# Patient Record
Sex: Male | Born: 1990
Health system: Southern US, Community
[De-identification: ages and names within clinical notes are randomized; demographics above are authoritative.]

## PROBLEM LIST (undated history)

## (undated) DIAGNOSIS — K219 Gastro-esophageal reflux disease without esophagitis: Secondary | ICD-10-CM

## (undated) DIAGNOSIS — E785 Hyperlipidemia, unspecified: Secondary | ICD-10-CM

## (undated) HISTORY — PX: ARTHROSCOPIC REPAIR ACL: SUR80

## (undated) HISTORY — DX: Gastro-esophageal reflux disease without esophagitis: K21.9

## (undated) HISTORY — DX: Hyperlipidemia, unspecified: E78.5

---
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
## 2012-08-31 ENCOUNTER — Encounter (HOSPITAL_COMMUNITY): Payer: Self-pay | Admitting: Emergency Medicine

## 2012-08-31 ENCOUNTER — Emergency Department (HOSPITAL_COMMUNITY)
Admission: EM | Admit: 2012-08-31 | Discharge: 2012-09-01 | Disposition: A | Discharge status: Home / Self Care | Payer: Managed Care, Other (non HMO) | Attending: Emergency Medicine | Admitting: Emergency Medicine

## 2012-08-31 DIAGNOSIS — K219 Gastro-esophageal reflux disease without esophagitis: Secondary | ICD-10-CM | POA: Insufficient documentation

## 2012-08-31 DIAGNOSIS — R110 Nausea: Secondary | ICD-10-CM | POA: Insufficient documentation

## 2012-08-31 NOTE — ED Notes (Signed)
Pt c/o weakness x 5 days, denies pain, nausea. C/o "weird" intermittent sensation in head. PWD.

## 2012-09-01 LAB — CBC
HCT: 48.1 % (ref 39.0–52.0)
Hemoglobin: 17.5 g/dL — ABNORMAL HIGH (ref 13.0–17.0)
MCH: 30.2 pg (ref 26.0–34.0)
MCHC: 36.4 g/dL — ABNORMAL HIGH (ref 30.0–36.0)
MCV: 83.1 fL (ref 78.0–100.0)
Platelets: 271 10*3/uL (ref 150–400)
WBC: 6.9 10*3/uL (ref 4.0–10.5)

## 2012-09-01 LAB — POCT I-STAT, CHEM 8
BUN: 19 mg/dL (ref 6–23)
Calcium, Ion: 1.22 mmol/L (ref 1.12–1.23)
Chloride: 103 mEq/L (ref 96–112)
Creatinine, Ser: 1.1 mg/dL (ref 0.50–1.35)
Glucose, Bld: 116 mg/dL — ABNORMAL HIGH (ref 70–99)
HCT: 51 % (ref 39.0–52.0)
Hemoglobin: 17.3 g/dL — ABNORMAL HIGH (ref 13.0–17.0)
Sodium: 141 mEq/L (ref 135–145)
TCO2: 29 mmol/L (ref 0–100)

## 2012-09-01 LAB — OCCULT BLOOD, POC DEVICE: Fecal Occult Bld: NEGATIVE

## 2012-09-01 LAB — GLUCOSE, CAPILLARY: Glucose-Capillary: 112 mg/dL — ABNORMAL HIGH (ref 70–99)

## 2012-09-01 MED ORDER — FAMOTIDINE 20 MG PO TABS: 20.0000 mg | ORAL_TABLET | Freq: Two times a day (BID) | ORAL | Status: DC

## 2012-09-01 MED ORDER — GI COCKTAIL ~~LOC~~
30.0000 mL | Freq: Once | ORAL | Status: AC
  Administered 2012-09-01: 30 mL via ORAL
  Filled 2012-09-01: qty 30

## 2012-09-01 MED ORDER — PANTOPRAZOLE SODIUM 40 MG IV SOLR
40.0000 mg | Freq: Once | INTRAVENOUS | Status: AC
  Administered 2012-09-01: 40 mg via INTRAVENOUS
  Filled 2012-09-01: qty 40

## 2012-09-01 MED ORDER — SODIUM CHLORIDE 0.9 % IV BOLUS (SEPSIS)
1000.0000 mL | Freq: Once | INTRAVENOUS | Status: AC
  Administered 2012-09-01: 1000 mL via INTRAVENOUS

## 2012-09-01 MED ORDER — FAMOTIDINE 20 MG PO TABS
20.0000 mg | ORAL_TABLET | Freq: Once | ORAL | Status: AC
  Administered 2012-09-01: 20 mg via ORAL
  Filled 2012-09-01: qty 1

## 2012-09-01 NOTE — ED Provider Notes (Signed)
History     CSN: [id number]  Arrival date & time 08/31/12  2147   First MD Initiated Contact with Patient 09/01/12 0025      Chief Complaint  Patient presents with  . Dizziness    (Consider location/radiation/quality/duration/timing/severity/associated sxs/prior treatment) HPI History provided by patient and parents bedside. The last few days his been having epigastric burning and discomfort with associated nausea. Patient also complaining some dizziness. No syncope. Had noticed some dark stools. No blood in stools. No history of GERD or peptic ulcer disease. Patient concerned that he has been drinking more soda and caffeine, and more bad foods with the holiday season. No right upper quadrant pain. No back pain. Food seems to make his symptoms better but then returned. Is also noticed symptoms worse at nighttime. No fevers. No flank pain or hematuria. No rash or difficulty breathing. No history of same. Symptoms mild/moderate in severity History reviewed. No pertinent past medical history.  Past Surgical History  Procedure Date  . Arthroscopic repair acl     No family history on file.  History  Substance Use Topics  . Smoking status: Never Smoker   . Smokeless tobacco: Not on file  . Alcohol Use: No      Review of Systems  Constitutional: Negative for fever and chills.  HENT: Negative for neck pain and neck stiffness.   Eyes: Negative for pain.  Respiratory: Negative for shortness of breath.   Cardiovascular: Negative for chest pain.  Gastrointestinal: Positive for nausea. Negative for vomiting and abdominal distention.  Genitourinary: Negative for dysuria.  Musculoskeletal: Negative for back pain.  Skin: Negative for rash.  Neurological: Negative for headaches.  All other systems reviewed and are negative.    Allergies  Review of patient's allergies indicates no known allergies.  Home Medications   Current Outpatient Rx  Name  Route  Sig  Dispense  Refill  .  ACETAMINOPHEN 500 MG PO TABS   Oral   Take 500 mg by mouth every 6 (six) hours as needed. pain           BP 126/75  Pulse 77  Temp 98.7 F (37.1 C) (Oral)  Resp 18  Ht 5\' 10"  (1.778 m)  Wt 145 lb (65.772 kg)  BMI 20.81 kg/m2  SpO2 100%  Physical Exam  Constitutional: He is oriented to person, place, and time. He appears well-developed and well-nourished.  HENT:  Head: Normocephalic and atraumatic.  Mouth/Throat: Oropharynx is clear and moist.  Eyes: EOM are normal. Pupils are equal, round, and reactive to light. No scleral icterus.  Neck: Neck supple.  Cardiovascular: Normal rate, regular rhythm and intact distal pulses.   Pulmonary/Chest: Effort normal. No respiratory distress.  Abdominal: Soft. Bowel sounds are normal. He exhibits no distension. There is no rebound and no guarding.       Mild epigastric tenderness. No right upper quadrant tenderness and no abdominal tenderness otherwise. Negative Murphy sign  Genitourinary:       Rectal exam: nontender, soft brown stool  Musculoskeletal: Normal range of motion. He exhibits no edema.  Neurological: He is alert and oriented to person, place, and time. No cranial nerve deficit.  Skin: Skin is warm and dry.    ED Course  Procedures (including critical care time)  Results for orders placed during the hospital encounter of 08/31/12  CBC      Component Value Range   WBC 6.9  4.0 - 10.5 K/uL   RBC 5.79  4.22 - 5.81 MIL/uL  Hemoglobin 17.5 (*) 13.0 - 17.0 g/dL   HCT 16.1  09.6 - 04.5 %   MCV 83.1  78.0 - 100.0 fL   MCH 30.2  26.0 - 34.0 pg   MCHC 36.4 (*) 30.0 - 36.0 g/dL   RDW 40.9  81.1 - 91.4 %   Platelets 271  150 - 400 K/uL  OCCULT BLOOD, POC DEVICE      Component Value Range   Fecal Occult Bld NEGATIVE  NEGATIVE  GLUCOSE, CAPILLARY      Component Value Range   Glucose-Capillary 112 (*) 70 - 99 mg/dL  POCT I-STAT, CHEM 8      Component Value Range   Sodium 141  135 - 145 mEq/L   Potassium 3.8  3.5 - 5.1  mEq/L   Chloride 103  96 - 112 mEq/L   BUN 19  6 - 23 mg/dL   Creatinine, Ser 7.82  0.50 - 1.35 mg/dL   Glucose, Bld 956 (*) 70 - 99 mg/dL   Calcium, Ion 2.13  0.86 - 1.23 mmol/L   TCO2 29  0 - 100 mmol/L   Hemoglobin 17.3 (*) 13.0 - 17.0 g/dL   HCT 57.8  46.9 - 62.9 %   IV fluids. IV Protonix. GI cocktail provided and epigastric discomfort improved. After 1 L of fluids patient ambulates emergency department with resolved dizziness. Plan Pepcid daily with GERD precautions and GI referral as needed.  MDM   Epigastric discomfort and symptoms of GERD. Hemoccult negative for blood and labs reviewed and no anemia. Improved with IV fluids, and GI medications. No indication for imaging at this time. Plan discharge home with Pepcid and GI followup. Vital signs and nursing notes reviewed.        Sunnie Nielsen, MD 09/01/12 [phone]

## 2012-09-01 NOTE — Discharge Instructions (Signed)
Diet for Gastroesophageal Reflux Disease, Adult  Reflux (acid reflux) is when acid from your stomach flows up into the esophagus. When acid comes in contact with the esophagus, the acid causes irritation and soreness (inflammation) in the esophagus. When reflux happens often or so severely that it causes damage to the esophagus, it is called gastroesophageal reflux disease (GERD). Nutrition therapy can help ease the discomfort of GERD.  FOODS OR DRINKS TO AVOID OR LIMIT   Smoking or chewing tobacco. Nicotine is one of the most potent stimulants to acid production in the gastrointestinal tract.   Caffeinated and decaffeinated coffee and black tea.   Regular or low-calorie carbonated beverages or energy drinks (caffeine-free carbonated beverages are allowed).    Strong spices, such as black pepper, white pepper, red pepper, cayenne, curry powder, and chili powder.   Peppermint or spearmint.   Chocolate.   High-fat foods, including meats and fried foods. Extra added fats including oils, butter, salad dressings, and nuts. Limit these to less than 8 tsp per day.   Fruits and vegetables if they are not tolerated, such as citrus fruits or tomatoes.   Alcohol.   Any food that seems to aggravate your condition.  If you have questions regarding your diet, call your caregiver or a registered dietitian.  OTHER THINGS THAT MAY HELP GERD INCLUDE:    Eating your meals slowly, in a relaxed setting.   Eating 5 to 6 small meals per day instead of 3 large meals.   Eliminating food for a period of time if it causes distress.   Not lying down until 3 hours after eating a meal.   Keeping the head of your bed raised 6 to 9 inches (15 to 23 cm) by using a foam wedge or blocks under the legs of the bed. Lying flat may make symptoms worse.   Being physically active. Weight loss may be helpful in reducing reflux in overweight or obese adults.   Wear loose fitting clothing  EXAMPLE MEAL PLAN  This meal plan is approximately  2,000 calories based on [employer] meal planning guidelines.  Breakfast    cup cooked oatmeal.   1 cup strawberries.   1 cup low-fat milk.   1 oz almonds.  Snack   1 cup cucumber slices.   6 oz yogurt (made from low-fat or fat-free milk).  Lunch   2 slice whole-wheat bread.   2 oz sliced turkey.   2 tsp mayonnaise.   1 cup blueberries.   1 cup snap peas.  Snack   6 whole-wheat crackers.   1 oz string cheese.  Dinner    cup brown rice.   1 cup mixed veggies.   1 tsp olive oil.   3 oz grilled fish.  Document Released: 08/22/2005 Document Revised: 11/14/2011 Document Reviewed: 07/08/2011  [employer] Patient Information 2013 [employer].

## 2014-06-17 ENCOUNTER — Other Ambulatory Visit: Payer: Self-pay | Admitting: Family Medicine

## 2014-06-17 ENCOUNTER — Ambulatory Visit
Admission: RE | Admit: 2014-06-17 | Discharge: 2014-06-17 | Disposition: A | Discharge status: Home / Self Care | Payer: Managed Care, Other (non HMO) | Source: Ambulatory Visit | Attending: Family Medicine | Admitting: Family Medicine

## 2014-06-17 DIAGNOSIS — N509 Disorder of male genital organs, unspecified: Secondary | ICD-10-CM

## 2015-05-08 IMAGING — US US SCROTUM
1 series · 14 of 25 positions shown · non-contrast
Comparison: None.

CLINICAL DATA: Right testicular mass detected 1 week ago.  No pain.

EXAM:
ULTRASOUND OF SCROTUM
TECHNIQUE: Complete ultrasound examination of the testicles, epididymis, and
other scrotal structures was performed.

[Series 1: us scrotum · 14 of 34 slices shown]
[im 1/34]
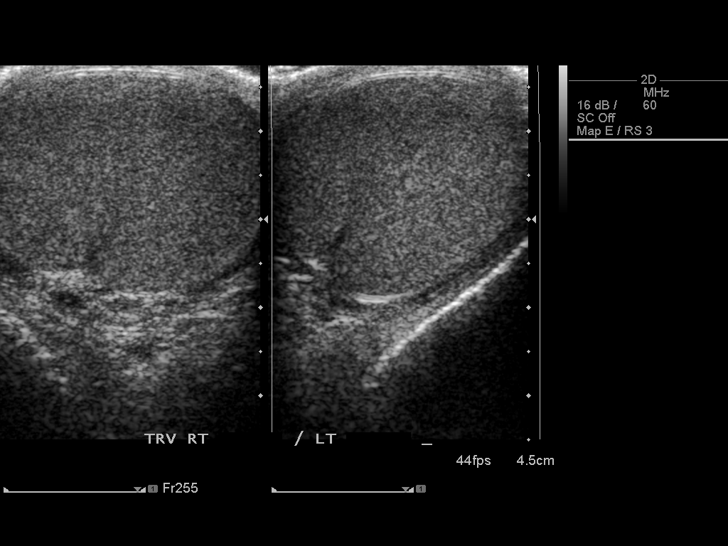
[im 3/34]
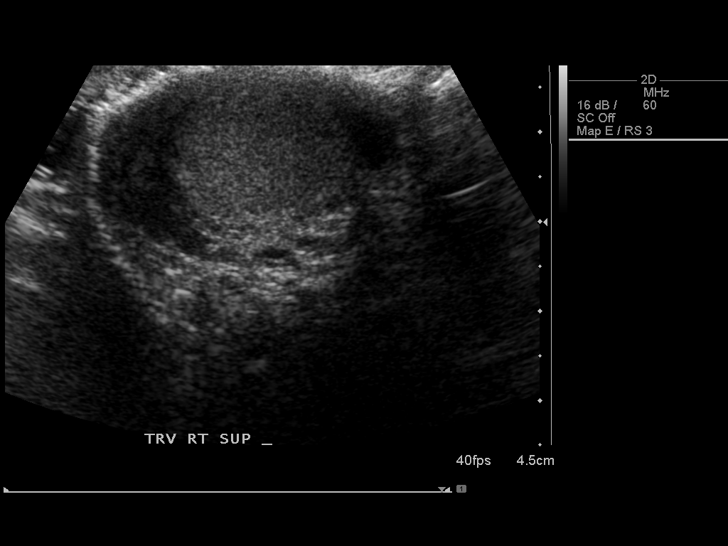
[im 6/34]
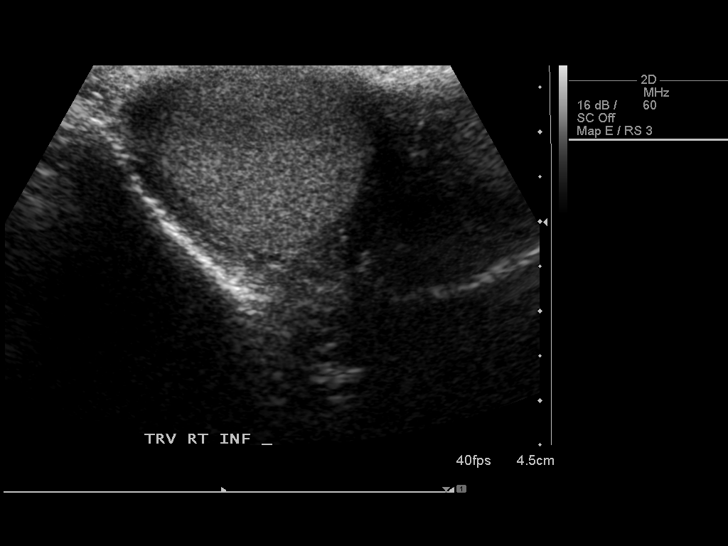
[im 9/34]
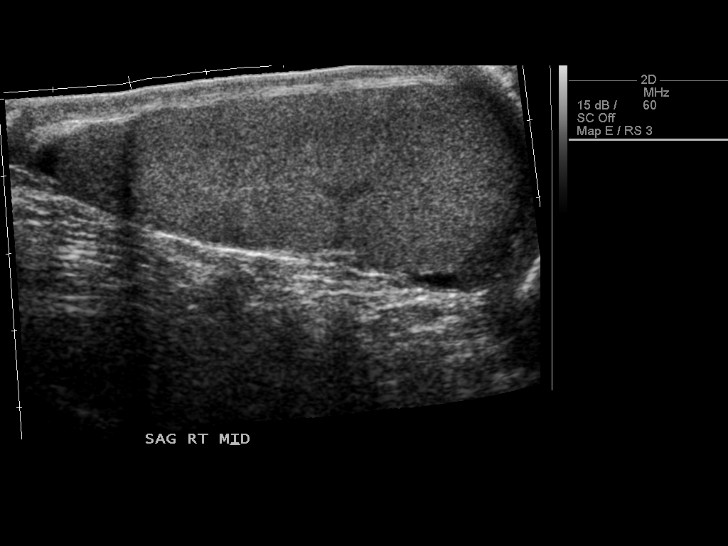
[im 12/34]
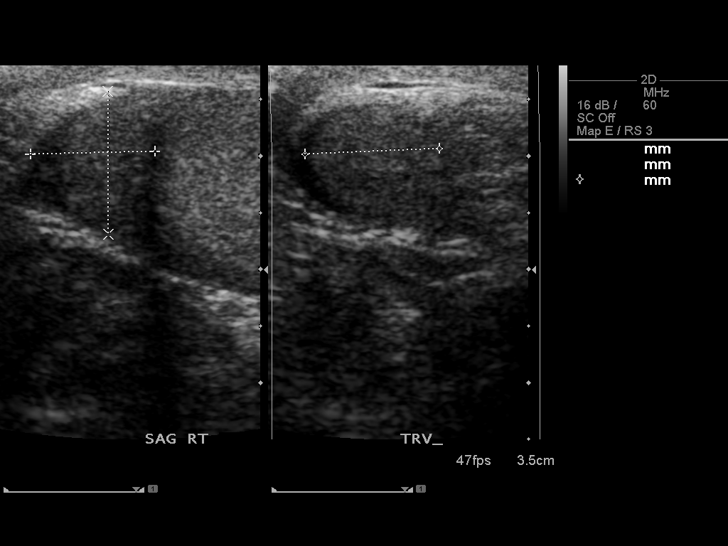
[im 13/34]
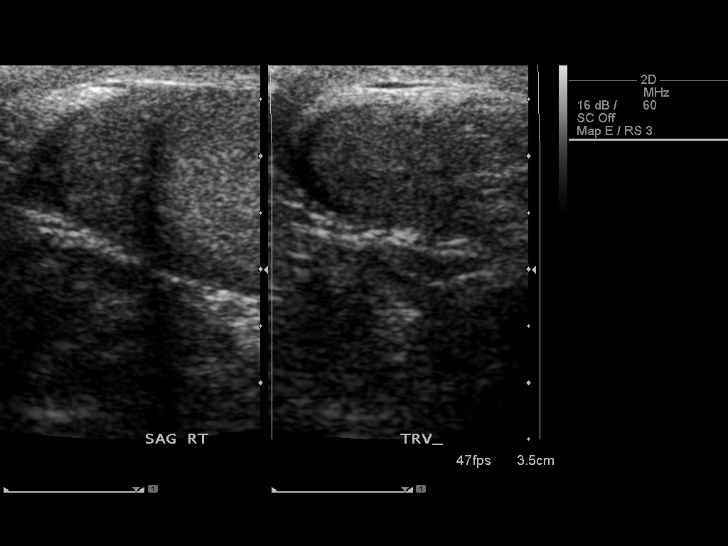
[im 16/34]
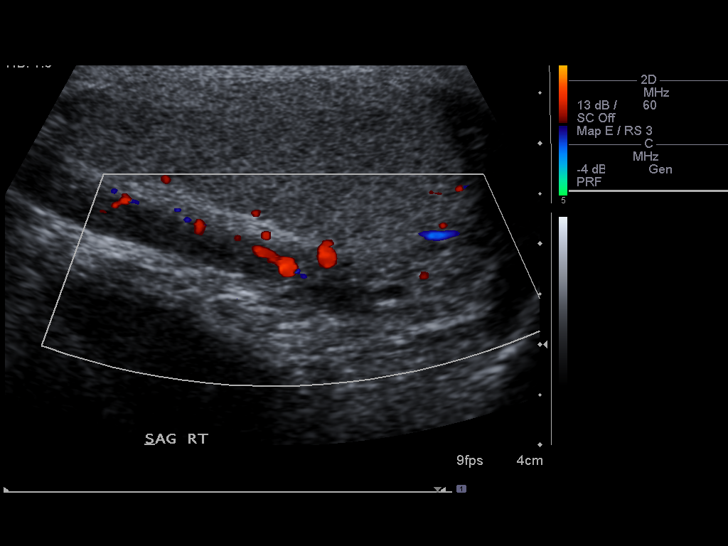
[im 18/34]
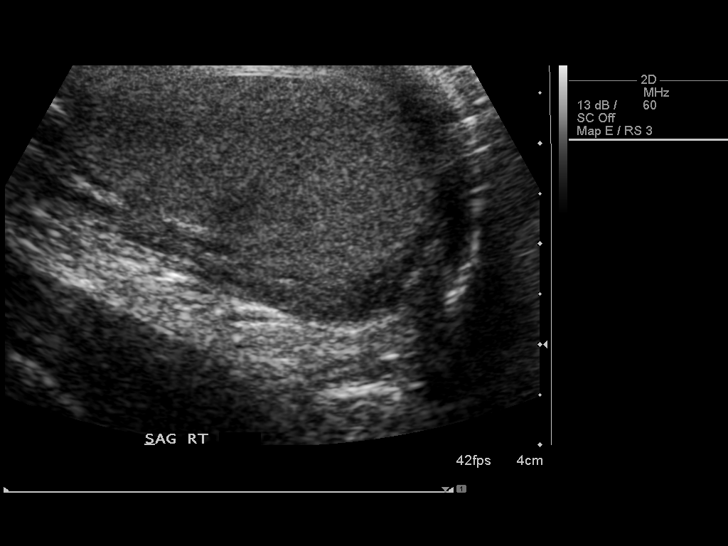
[im 21/34]
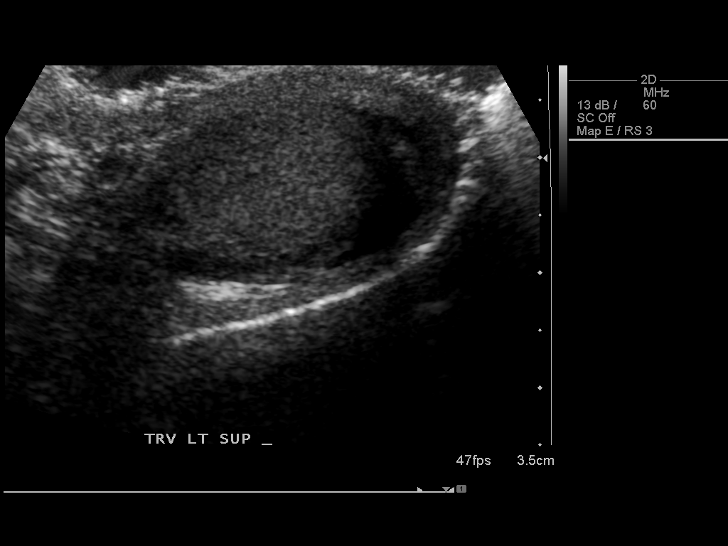
[im 23/34]
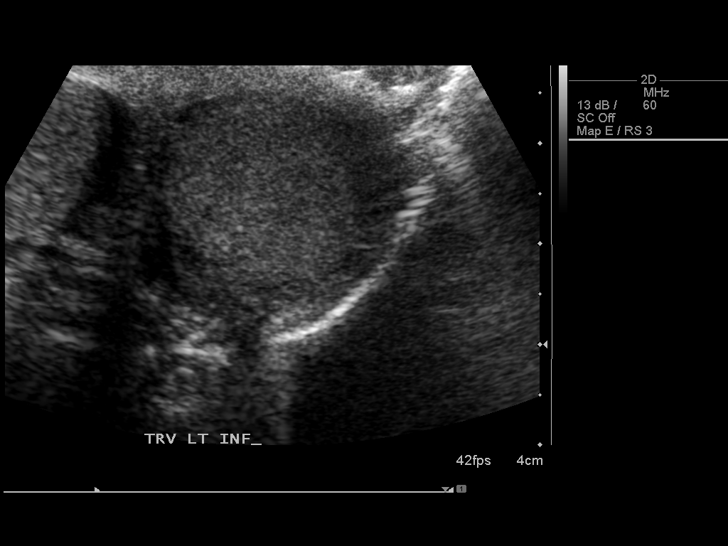
[im 25/34]
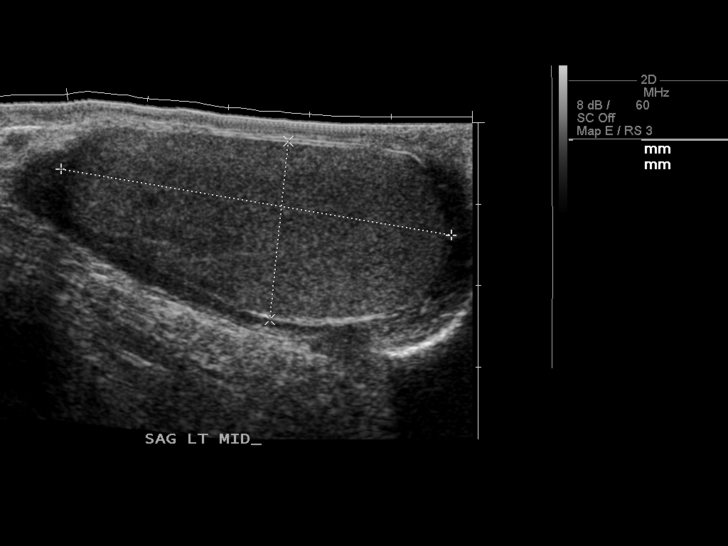
[im 28/34]
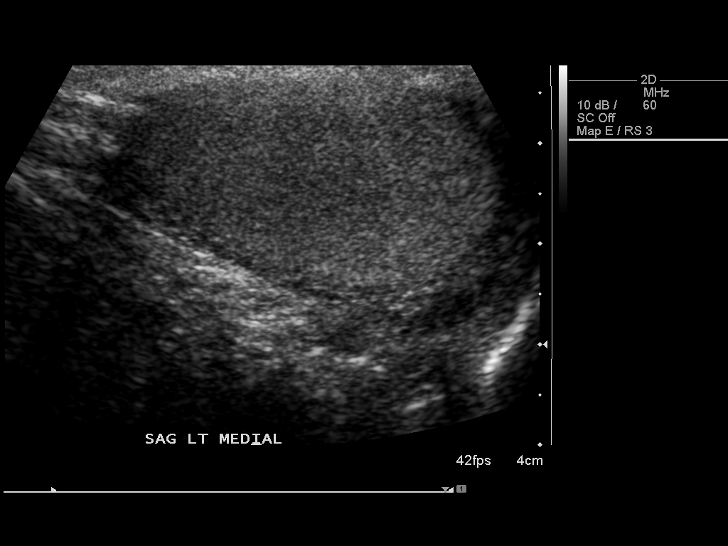
[im 31/34]
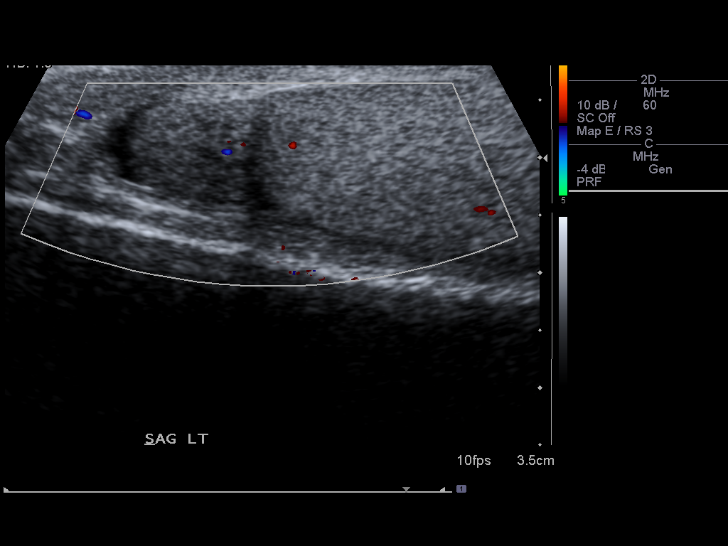
[im 34/34]
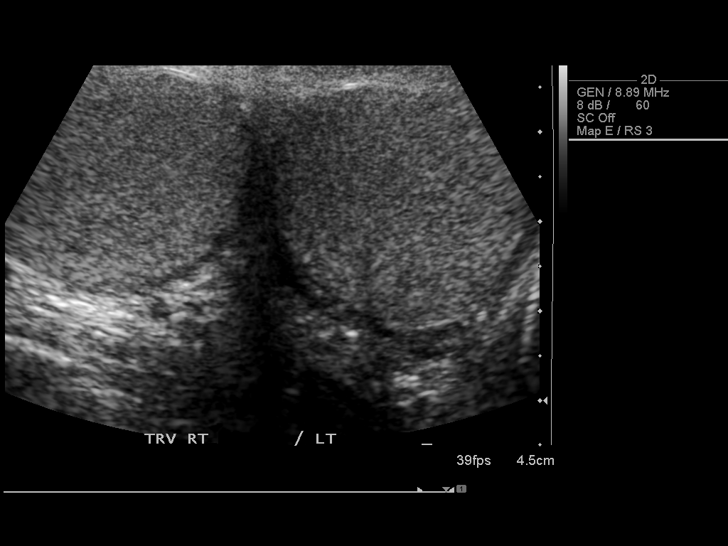

[14 of 25 positions shown; findings below may reference images not displayed]

FINDINGS: Right testicle

Measurements: 5.1 by 2.5 by 3.6 cm. No mass or microlithiasis
visualized.

Left testicle

Measurements: 4.9 by 2.2 by 3.1 cm. No mass or microlithiasis
visualized.

Right epididymis:  Normal in size and appearance.

Left epididymis:  Normal in size and appearance.

Hydrocele:  None visualized.

Varicocele:  None visualized.
IMPRESSION: 1. The area being palpated appears to be the right epididymal head,
which has a normal and reasonably symmetric appearance to the left
side. No testicular mass is observed. If progressive enlargement or
development of pain is encounter, then repeat sonography may be
warranted in the future.

## 2020-04-04 ENCOUNTER — Emergency Department (HOSPITAL_BASED_OUTPATIENT_CLINIC_OR_DEPARTMENT_OTHER)
Admission: EM | Admit: 2020-04-04 | Discharge: 2020-04-04 | Disposition: A | Discharge status: Home / Self Care | Payer: Managed Care, Other (non HMO) | Attending: Emergency Medicine | Admitting: Emergency Medicine

## 2020-04-04 ENCOUNTER — Encounter (HOSPITAL_BASED_OUTPATIENT_CLINIC_OR_DEPARTMENT_OTHER): Payer: Self-pay | Admitting: Emergency Medicine

## 2020-04-04 ENCOUNTER — Emergency Department (HOSPITAL_BASED_OUTPATIENT_CLINIC_OR_DEPARTMENT_OTHER): Payer: Managed Care, Other (non HMO)

## 2020-04-04 ENCOUNTER — Other Ambulatory Visit: Payer: Self-pay

## 2020-04-04 DIAGNOSIS — N200 Calculus of kidney: Secondary | ICD-10-CM | POA: Insufficient documentation

## 2020-04-04 DIAGNOSIS — R350 Frequency of micturition: Secondary | ICD-10-CM | POA: Insufficient documentation

## 2020-04-04 DIAGNOSIS — N3001 Acute cystitis with hematuria: Secondary | ICD-10-CM | POA: Diagnosis not present

## 2020-04-04 DIAGNOSIS — M545 Low back pain: Secondary | ICD-10-CM | POA: Diagnosis present

## 2020-04-04 LAB — URINALYSIS, MICROSCOPIC (REFLEX)

## 2020-04-04 LAB — COMPREHENSIVE METABOLIC PANEL
ALT: 17 U/L (ref 0–44)
AST: 16 U/L (ref 15–41)
Albumin: 5 g/dL (ref 3.5–5.0)
Alkaline Phosphatase: 57 U/L (ref 38–126)
Anion gap: 13 (ref 5–15)
BUN: 14 mg/dL (ref 6–20)
CO2: 25 mmol/L (ref 22–32)
Calcium: 9.4 mg/dL (ref 8.9–10.3)
Chloride: 99 mmol/L (ref 98–111)
Creatinine, Ser: 0.97 mg/dL (ref 0.61–1.24)
GFR calc Af Amer: >60 mL/min (ref >60)
GFR calc non Af Amer: >60 mL/min (ref >60)
Glucose, Bld: 111 mg/dL — ABNORMAL HIGH (ref 70–99)
Potassium: 3.5 mmol/L (ref 3.5–5.1)
Sodium: 137 mmol/L (ref 135–145)
Total Bilirubin: 1.1 mg/dL (ref 0.3–1.2)
Total Protein: 7.9 g/dL (ref 6.5–8.1)

## 2020-04-04 LAB — CBC WITH DIFFERENTIAL/PLATELET
Abs Immature Granulocytes: 0.03 10*3/uL (ref 0.00–0.07)
Basophils Absolute: 0 10*3/uL (ref 0.0–0.1)
Basophils Relative: 0 %
Eosinophils Absolute: 0 10*3/uL (ref 0.0–0.5)
Eosinophils Relative: 0 %
HCT: 50.9 % (ref 39.0–52.0)
Hemoglobin: 17.3 g/dL — ABNORMAL HIGH (ref 13.0–17.0)
Immature Granulocytes: 0 %
Lymphocytes Relative: 15 %
Lymphs Abs: 1.4 10*3/uL (ref 0.7–4.0)
MCH: 29.1 pg (ref 26.0–34.0)
MCHC: 34 g/dL (ref 30.0–36.0)
MCV: 85.7 fL (ref 80.0–100.0)
Monocytes Absolute: 0.5 10*3/uL (ref 0.1–1.0)
Monocytes Relative: 5 %
Neutro Abs: 8 10*3/uL — ABNORMAL HIGH (ref 1.7–7.7)
Neutrophils Relative %: 80 %
Platelets: 279 10*3/uL (ref 150–400)
RBC: 5.94 MIL/uL — ABNORMAL HIGH (ref 4.22–5.81)
RDW: 11.9 % (ref 11.5–15.5)
WBC: 9.9 10*3/uL (ref 4.0–10.5)
nRBC: 0 % (ref 0.0–0.2)

## 2020-04-04 LAB — URINALYSIS, ROUTINE W REFLEX MICROSCOPIC
Bilirubin Urine: NEGATIVE
Glucose, UA: 250 mg/dL — AB
Ketones, ur: NEGATIVE mg/dL
Nitrite: POSITIVE — AB
Protein, ur: 100 mg/dL — AB
Specific Gravity, Urine: >1.03 — ABNORMAL HIGH (ref 1.005–1.030)
pH: 5 (ref 5.0–8.0)

## 2020-04-04 LAB — LACTIC ACID, PLASMA: Lactic Acid, Venous: 1.7 mmol/L (ref 0.5–1.9)

## 2020-04-04 MED ORDER — SODIUM CHLORIDE 0.9 % IV SOLN
1.0000 g | Freq: Once | INTRAVENOUS | Status: AC
  Administered 2020-04-04: 1 g via INTRAVENOUS
  Filled 2020-04-04: qty 10

## 2020-04-04 MED ORDER — SODIUM CHLORIDE 0.9 % IV BOLUS (SEPSIS)
1000.0000 mL | Freq: Once | INTRAVENOUS | Status: AC
  Administered 2020-04-04: 1000 mL via INTRAVENOUS

## 2020-04-04 MED ORDER — ONDANSETRON 4 MG PO TBDP
4.0000 mg | ORAL_TABLET | Freq: Three times a day (TID) | ORAL | 0 refills | Status: DC | PRN
Start: 2020-04-04 — End: 2023-05-23

## 2020-04-04 MED ORDER — KETOROLAC TROMETHAMINE 30 MG/ML IJ SOLN
30.0000 mg | Freq: Once | INTRAMUSCULAR | Status: AC
  Administered 2020-04-04: 30 mg via INTRAVENOUS
  Filled 2020-04-04: qty 1

## 2020-04-04 MED ORDER — SODIUM CHLORIDE 0.9 % IV SOLN: 1000.0000 mL | INTRAVENOUS | Status: DC

## 2020-04-04 MED ORDER — OXYCODONE-ACETAMINOPHEN 5-325 MG PO TABS: 2.0000 | ORAL_TABLET | ORAL | 0 refills | Status: DC | PRN

## 2020-04-04 MED ORDER — TAMSULOSIN HCL 0.4 MG PO CAPS
0.4000 mg | ORAL_CAPSULE | Freq: Once | ORAL | Status: AC
  Administered 2020-04-04: 0.4 mg via ORAL
  Filled 2020-04-04: qty 1

## 2020-04-04 MED ORDER — TAMSULOSIN HCL 0.4 MG PO CAPS
0.4000 mg | ORAL_CAPSULE | Freq: Every day | ORAL | 0 refills | Status: DC
Start: 2020-04-04 — End: 2023-05-23

## 2020-04-04 MED ORDER — CEPHALEXIN 500 MG PO CAPS
500.0000 mg | ORAL_CAPSULE | Freq: Four times a day (QID) | ORAL | 0 refills | Status: DC
Start: 2020-04-04 — End: 2023-05-23

## 2020-04-04 NOTE — ED Notes (Signed)
Patient transported to CT 

## 2020-04-04 NOTE — ED Notes (Signed)
Unable to get blood times 2 for2nd blood culture. MD aware.

## 2020-04-04 NOTE — ED Triage Notes (Addendum)
R lower back pain radiating into abd. It happened 1 week ago for 45 min and then returned today. Also endorses urinary frequency. He took AZO

## 2020-04-04 NOTE — ED Provider Notes (Signed)
[facility] EMERGENCY DEPARTMENT Provider Note   CSN: [id number] Arrival date & time: 04/04/20  1104     History Chief Complaint  Patient presents with  . Back Pain  . Urinary Frequency    Arthur Rodriguez is a 29 y.o. male.  HPI Reports he had an episode a week ago where he got a sharp pain in his lower right back that lasted for about 45 minutes and then resolved.  He has had urinary frequency and some urgency over this past week.  He denies it burns when he urinates but he does have urgency.  He denies pain into the testicles.  No fever.  He did vomit today.  He reports he is coming in because he had another episode of that sharp right lower back pain today.  It has since resolved again.  He has been trying some over-the-counter Azo but continues to have urinary frequency.  He is sexually active with 1 male partner, married.  No prior history of kidney stones.  He does report his mother frequently gets kidney stones.    History reviewed. No pertinent past medical history.  There are no problems to display for this patient.   Past Surgical History:  Procedure Laterality Date  . ARTHROSCOPIC REPAIR ACL         No family history on file.  Social History   Tobacco Use  . Smoking status: Never Smoker  . Smokeless tobacco: Never Used  Substance Use Topics  . Alcohol use: Yes  . Drug use: No    Home Medications Prior to Admission medications   Medication Sig Start Date End Date Taking? Authorizing Provider  acetaminophen (TYLENOL) 500 MG tablet Take 500 mg by mouth every 6 (six) hours as needed. pain    [provider]  famotidine (PEPCID) 20 MG tablet Take 1 tablet (20 mg total) by mouth 2 (two) times daily. 09/01/12   Sunnie Nielsen, MD    Allergies    Patient has no known allergies.  Review of Systems   Review of Systems Systems reviewed and negative except as per HPI Physical Exam Updated Vital Signs BP (!) 139/95 (BP Location: Right  Arm)   Pulse (!) 110   Temp 97.7 F (36.5 C) (Oral)   Resp 16   Ht 5\' 10"  (1.778 m)   Wt 74.8 kg   SpO2 100%   BMI 23.68 kg/m   Physical Exam Constitutional:      Appearance: Normal appearance.  Eyes:     Extraocular Movements: Extraocular movements intact.  Cardiovascular:     Rate and Rhythm: Normal rate and regular rhythm.  Pulmonary:     Effort: Pulmonary effort is normal.     Breath sounds: Normal breath sounds.  Abdominal:     General: There is no distension.     Palpations: Abdomen is soft.     Tenderness: There is no abdominal tenderness. There is no guarding.  Musculoskeletal:        General: No swelling. Normal range of motion.     Right lower leg: No edema.     Left lower leg: No edema.  Skin:    General: Skin is warm and dry.  Neurological:     General: No focal deficit present.     Mental Status: He is alert and oriented to person, place, and time.     Coordination: Coordination normal.  Psychiatric:        Mood and Affect: Mood normal.  ED Results / Procedures / Treatments   Labs (all labs ordered are listed, but only abnormal results are displayed) Labs Reviewed  URINALYSIS, ROUTINE W REFLEX MICROSCOPIC - Abnormal; Notable for the following components:      Result Value   Color, Urine ORANGE (*)    Specific Gravity, Urine >1.030 (*)    Glucose, UA 250 (*)    Hgb urine dipstick MODERATE (*)    Protein, ur 100 (*)    Nitrite POSITIVE (*)    Leukocytes,Ua TRACE (*)    All other components within normal limits  URINALYSIS, MICROSCOPIC (REFLEX) - Abnormal; Notable for the following components:   Bacteria, UA MANY (*)    All other components within normal limits  URINE CULTURE  CULTURE, BLOOD (ROUTINE X 2)  CULTURE, BLOOD (ROUTINE X 2)  COMPREHENSIVE METABOLIC PANEL  LACTIC ACID, PLASMA  LACTIC ACID, PLASMA  CBC WITH DIFFERENTIAL/PLATELET  GC/CHLAMYDIA PROBE AMP ([facility]) NOT AT [facility]    EKG None  Radiology CT Renal Stone  Study  Result Date: 04/04/2020 CLINICAL DATA:  Flank pain with urinary frequency. Right-sided pain. EXAM: CT ABDOMEN AND PELVIS WITHOUT CONTRAST TECHNIQUE: Multidetector CT imaging of the abdomen and pelvis was performed following the standard protocol without IV contrast. COMPARISON:  None. FINDINGS: Lower chest: The lung bases are clear. Hepatobiliary: No focal liver abnormality is seen. No gallstones, gallbladder wall thickening, or biliary dilatation. Pancreas: No ductal dilatation or inflammation. Spleen: Normal in size without focal abnormality. Adrenals/Urinary Tract: Normal adrenal glands. There is an obstructing 3 x 4 mm stone in the distal right ureter approximately 1 cm proximal to the ureterovesicular junction with minimal right hydroureteronephrosis. No significant perinephric edema. No left hydronephrosis. No additional stones in either kidney. No stone along the course of the ureter. Urinary bladder is partially distended. No bladder stone or wall thickening. Stomach/Bowel: Stomach is within normal limits. Appendix appears normal. No evidence of bowel wall thickening, distention, or inflammatory changes. Vascular/Lymphatic: Abdominal aorta is normal in caliber. No bulky abdominopelvic adenopathy. Reproductive: Prostate is unremarkable. Other: No free air, free fluid, or intra-abdominal fluid collection. Tiny fat containing umbilical hernia. 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 Hemi transitional lumbosacral anatomy with enlarged left transverse process pseudo articulating with the sacrum. Benign-appearing peripherally sclerotic density in the left iliac bone. There are no acute or suspicious osseous abnormalities. IMPRESSION: Obstructing 3 x 4 mm stone in the distal right ureter with minimal right hydroureteronephrosis. Electronically Signed   By: Narda Rutherford M.D.   On: 04/04/2020 15:17    Procedures Procedures (including critical care time)  Medications Ordered in  ED Medications  sodium chloride 0.9 % bolus 1,000 mL (has no administration in time range)    Followed by  0.9 %  sodium chloride infusion (has no administration in time range)  cefTRIAXone (ROCEPHIN) 1 g in sodium chloride 0.9 % 100 mL IVPB (has no administration in time range)  tamsulosin (FLOMAX) capsule 0.4 mg (has no administration in time range)    ED Course  I have reviewed the triage vital signs and the nursing notes.  Pertinent labs & imaging results that were available during my care of the patient were reviewed by me and considered in my medical decision making (see chart for details).    MDM Rules/Calculators/A&P                          Patient presents as aligned above.  CT shows a 3  to 4 mm obstructing right-sided stone.  Urinalysis is positive for UTI.  Will obtain labs and administer fluids, Flomax and Rocephin.  Once labs returned, plan for consult with urology.  Patient is pain-free at this time.  Dr. Clarene Duke to follow-up on lab results and review with urology for final disposition. Final Clinical Impression(s) / ED Diagnoses Final diagnoses:  Kidney stone  Acute cystitis with hematuria    Rx / DC Orders ED Discharge Orders    None       Arby Barrette, MD 04/04/20 [phone]

## 2020-04-04 NOTE — ED Provider Notes (Signed)
I received this patient in signout from Dr. Donnald Garre.  Briefly, he had presented with symptoms suggestive of kidney stone and CT confirmed right sided distal ureteral stone with mild hydronephrosis.  At time of signout, awaiting completion of lab work to evaluate kidney function and white blood cell count.  Lab work shows normal WBC count, normal creatinine, reassuring CMP.  UA shows nitrite positive, trace leukocytes, 0-5 WBCs, many bacteria.  He did take Azo prior to arrival which may explain his nitrite positive.  Because of borderline UA, sent urine culture and will start on course of Keflex.  Discussed follow-up with urology as well as supportive measures including Flomax, pain and nausea medications as needed.  Viewed return precautions including fever, intractable pain, intractable vomiting.  Patient voiced understanding.  He is pain-free at time of discharge.   Reagyn Facemire, Ambrose Finland, MD 04/04/20 1757

## 2020-04-06 LAB — URINE CULTURE: Culture: NO GROWTH

## 2020-04-06 LAB — GC/CHLAMYDIA PROBE AMP (~~LOC~~) NOT AT ARMC
Chlamydia: NEGATIVE
Comment: NEGATIVE
Comment: NORMAL
Neisseria Gonorrhea: NEGATIVE

## 2020-04-09 LAB — CULTURE, BLOOD (ROUTINE X 2)
Culture: NO GROWTH
Special Requests: ADEQUATE

## 2021-02-23 IMAGING — CT CT RENAL STONE PROTOCOL
2 of 4 series · 16 of 46 positions shown, 18 images · non-contrast
Comparison: None.

CLINICAL DATA: Flank pain with urinary frequency. Right-sided pain.

EXAM:
CT ABDOMEN AND PELVIS WITHOUT CONTRAST
TECHNIQUE: Multidetector CT imaging of the abdomen and pelvis was performed
following the standard protocol without IV contrast.

[Series 2: axial st · axial · 0.73mm/px · z∈[-466,-20]mm · 13 of 97 slices shown, 15 images]
[im 4/97  soft-tissue]
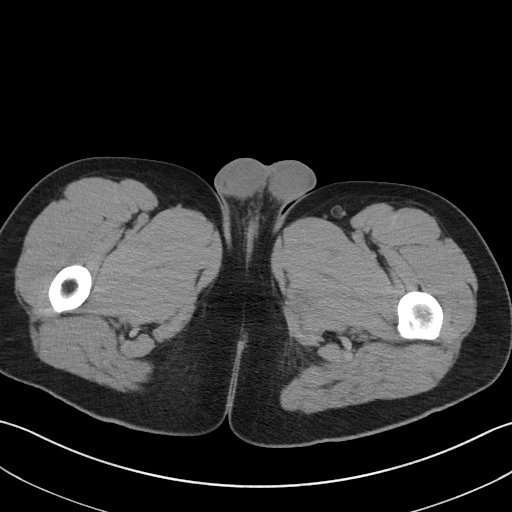
[im 4/97  bone]
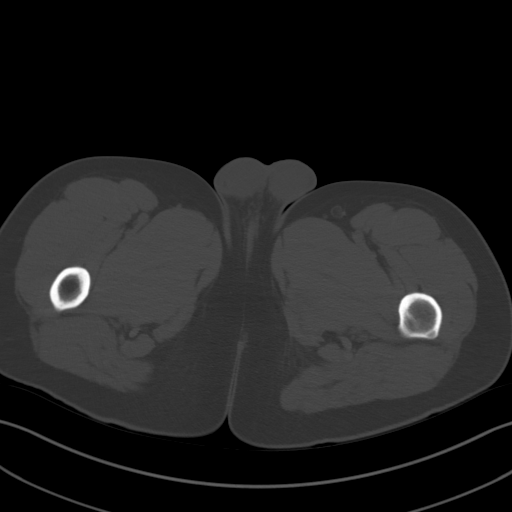
[im 12/97  soft-tissue]
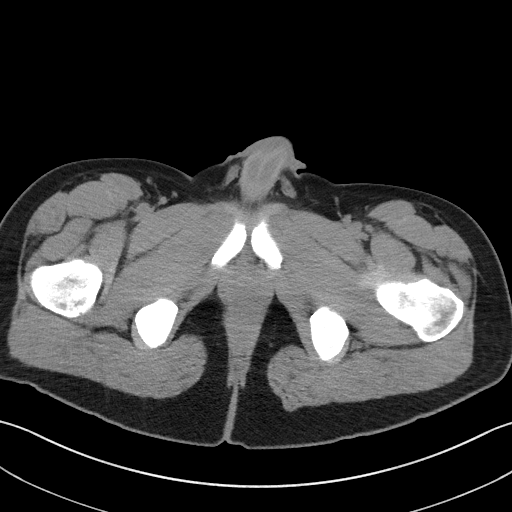
[im 19/97  soft-tissue]
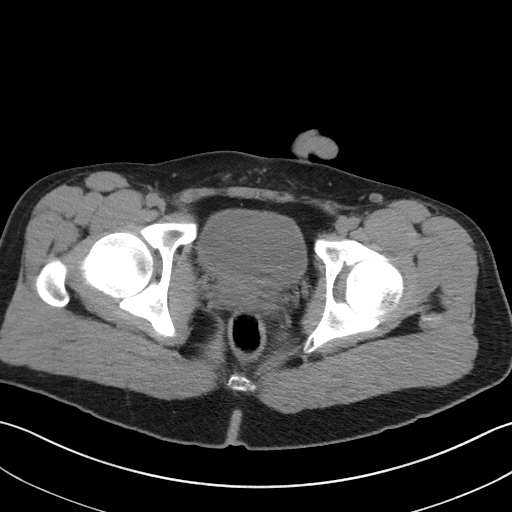
[im 26/97  soft-tissue]
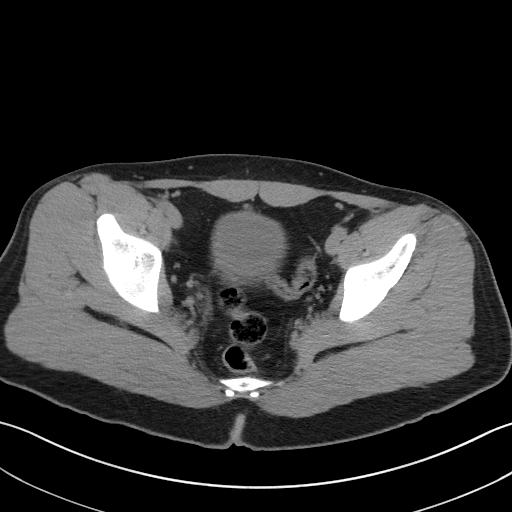
[im 34/97  soft-tissue]
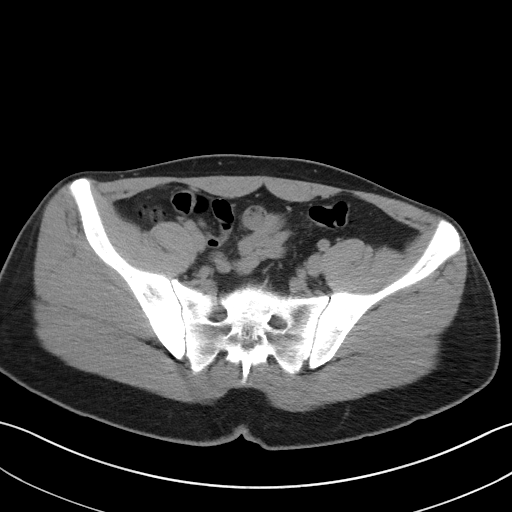
[im 41/97  soft-tissue]
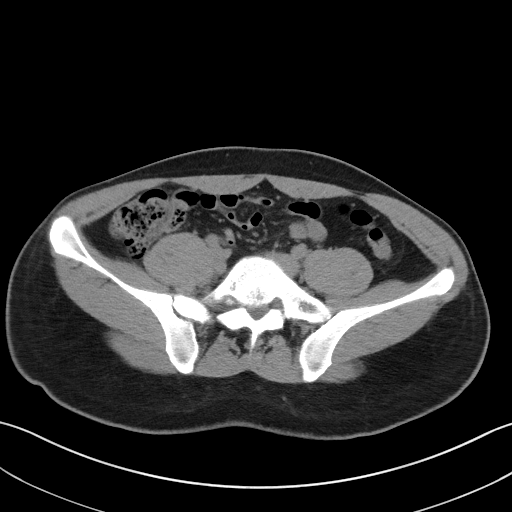
[im 49/97  soft-tissue]
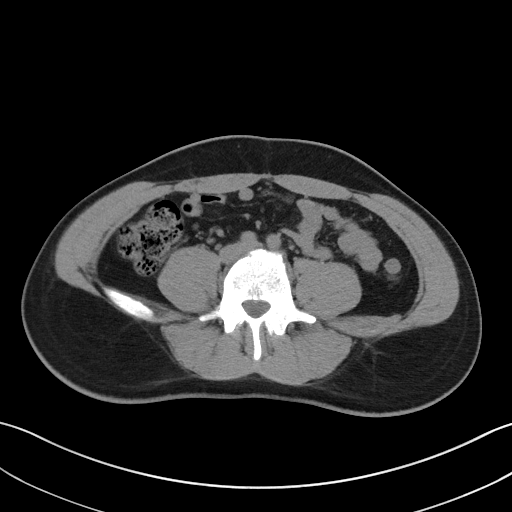
[im 56/97  soft-tissue]
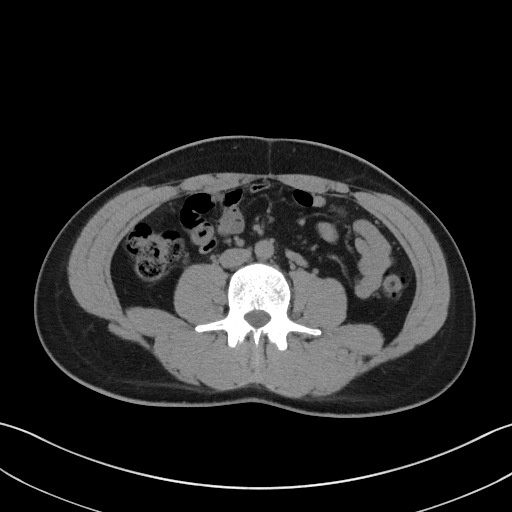
[im 63/97  soft-tissue]
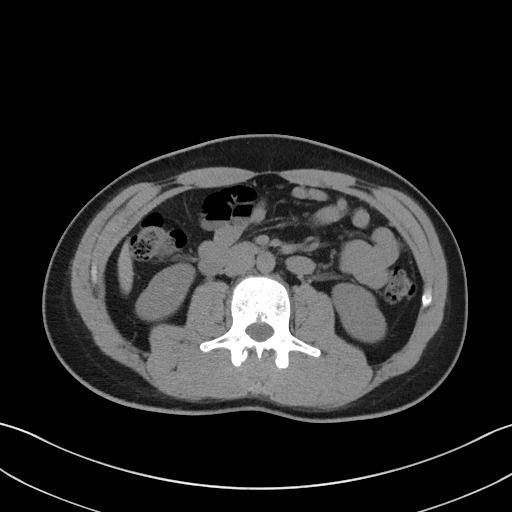
[im 63/97  bone]
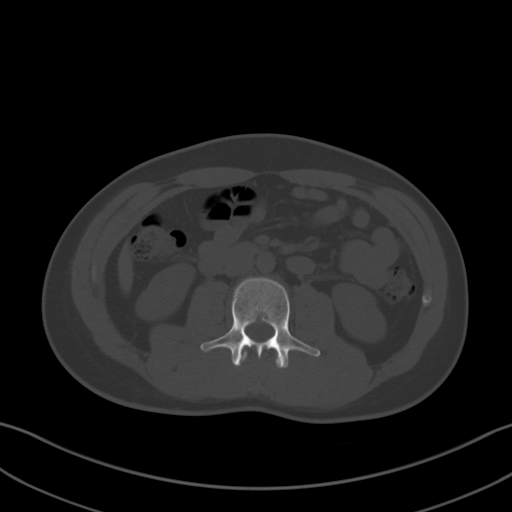
[im 71/97  soft-tissue]
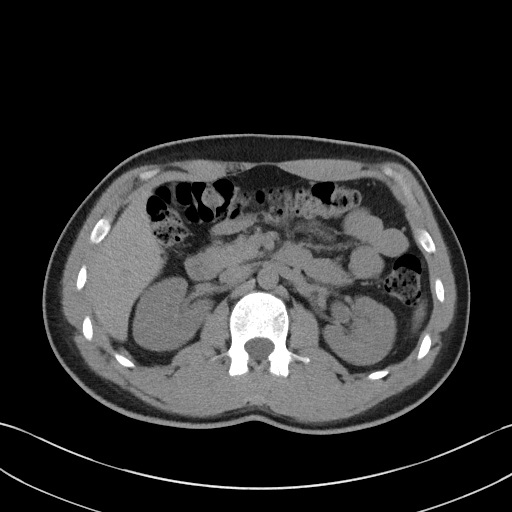
[im 78/97  soft-tissue]
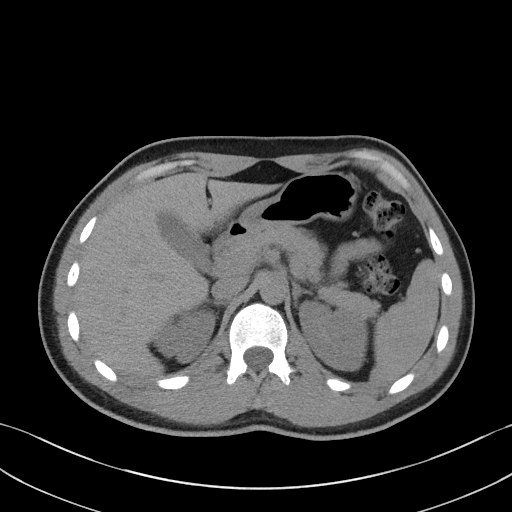
[im 85/97  soft-tissue]
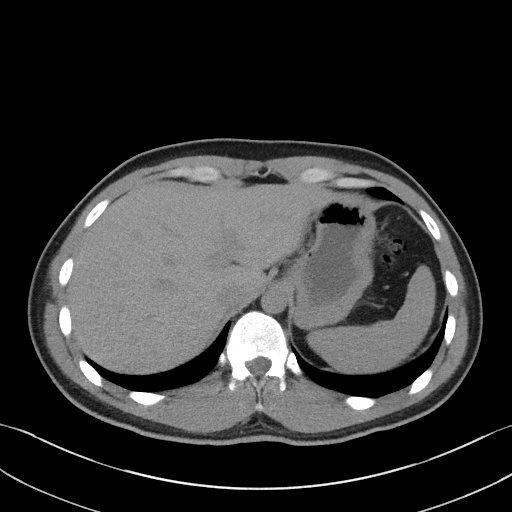
[im 93/97  soft-tissue]
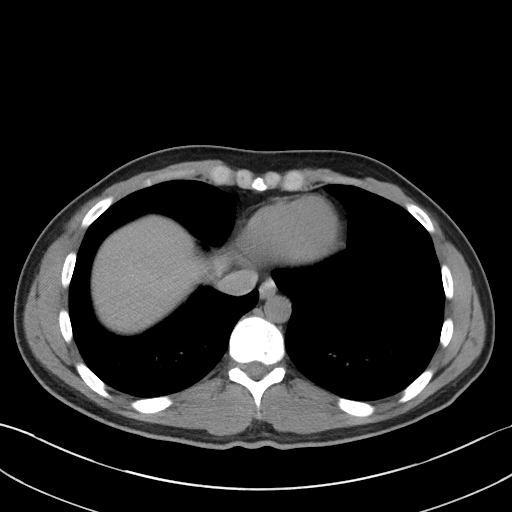

[Series 5: coronal st · coronal · 0.80mm/px · 3 of 68 slices shown]
[im 23/68  soft-tissue]
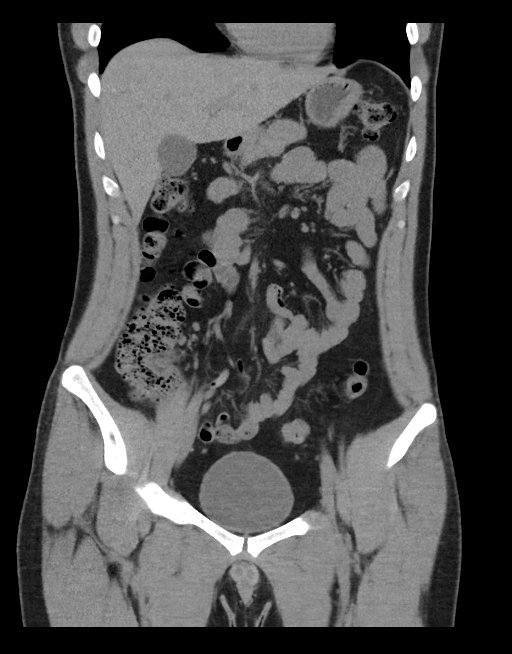
[im 30/68  soft-tissue]
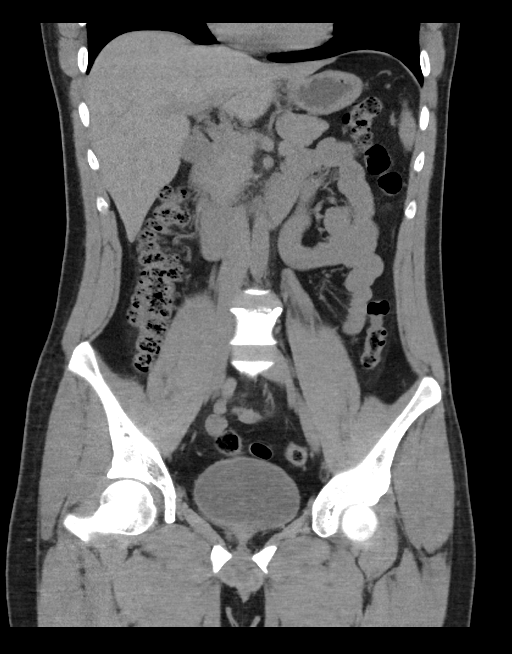
[im 38/68  soft-tissue]
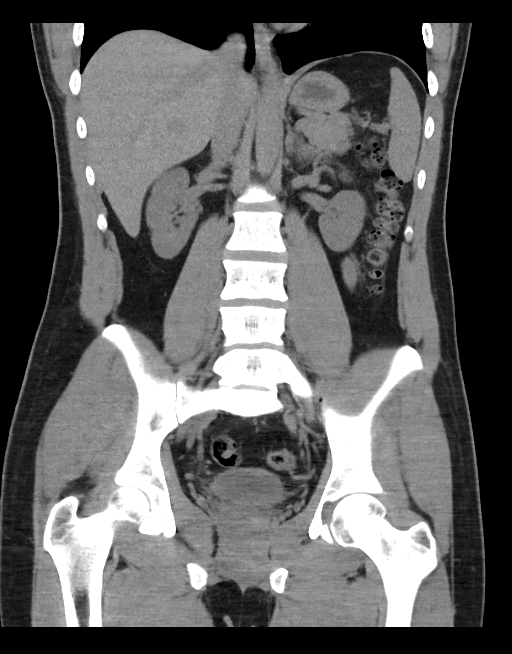

[16 of 46 positions shown; findings below may reference images not displayed]

FINDINGS: Lower chest: The lung bases are clear.

Hepatobiliary: No focal liver abnormality is seen. No gallstones,
gallbladder wall thickening, or biliary dilatation.

Pancreas: No ductal dilatation or inflammation.

Spleen: Normal in size without focal abnormality.

Adrenals/Urinary Tract: Normal adrenal glands. There is an
obstructing 3 x 4 mm stone in the distal right ureter approximately
1 cm proximal to the ureterovesicular junction with minimal right
hydroureteronephrosis. No significant perinephric edema. No left
hydronephrosis. No additional stones in either kidney. No stone
along the course of the ureter. Urinary bladder is partially
distended. No bladder stone or wall thickening.

Stomach/Bowel: Stomach is within normal limits. Appendix appears
normal. No evidence of bowel wall thickening, distention, or
inflammatory changes.

Vascular/Lymphatic: Abdominal aorta is normal in caliber. No bulky
abdominopelvic adenopathy.

Reproductive: Prostate is unremarkable.

Other: No free air, free fluid, or intra-abdominal fluid collection.
Tiny fat containing umbilical hernia.

Musculoskeletal: Incidental limbic vertebra at L4. Hemi transitional
lumbosacral anatomy with enlarged left transverse process pseudo
articulating with the sacrum. Benign-appearing peripherally
sclerotic density in the left iliac bone. There are no acute or
suspicious osseous abnormalities.
IMPRESSION: Obstructing 3 x 4 mm stone in the distal right ureter with minimal
right hydroureteronephrosis.

## 2021-06-16 DIAGNOSIS — E785 Hyperlipidemia, unspecified: Secondary | ICD-10-CM | POA: Insufficient documentation

## 2023-05-23 ENCOUNTER — Encounter: Payer: Self-pay | Admitting: Nurse Practitioner

## 2023-05-23 ENCOUNTER — Ambulatory Visit (INDEPENDENT_AMBULATORY_CARE_PROVIDER_SITE_OTHER): Payer: Managed Care, Other (non HMO) | Admitting: Nurse Practitioner

## 2023-05-23 VITALS — BP 138/88 | HR 79 | Temp 98.2°F | Ht 70.0 in | Wt 168.4 lb

## 2023-05-23 DIAGNOSIS — E782 Mixed hyperlipidemia: Secondary | ICD-10-CM | POA: Diagnosis not present

## 2023-05-23 DIAGNOSIS — Z1329 Encounter for screening for other suspected endocrine disorder: Secondary | ICD-10-CM | POA: Diagnosis not present

## 2023-05-23 DIAGNOSIS — Z0000 Encounter for general adult medical examination without abnormal findings: Secondary | ICD-10-CM | POA: Insufficient documentation

## 2023-05-23 NOTE — Assessment & Plan Note (Addendum)
Chronic. Will check fasting lipid panel. Counseled on healthy diet and exercise. Information provided to patient. Will continue to monitor.

## 2023-05-23 NOTE — Progress Notes (Signed)
Bethanie Dicker, NP-C Phone: [phone]  Arthur Rodriguez is a 32 y.o. male who presents today to establish care and for annual exam. He has no complaints or new concerns today. He is not on any medications.   HYPERLIPIDEMIA Symptoms Chest pain on exertion:  No   Leg claudication:   No Medications: Compliance- Diet controlled Right upper quadrant pain- No  Muscle aches- No Lipid Panel 06/11/2021- Chol- 220, Trig- 197, HDL- 35, LDL- 179   Diet: Travels a lot for work, eats out frequently, tries to make healthier choices, no junk food/snacks Exercise: Gym- weights 2-3 times per week Family history-  Prostate cancer: No  Colon cancer: No Sexually active: Yes Vaccines-   Flu: Not due  Tetanus: UTD per patient  COVID19: x 2 HIV screening: Declined Hep C Screening: Declined Tobacco use: No Alcohol use: Yes, 2 times per week, occasionally more when travelling Illicit Drug use: No Dentist: Yes Ophthalmology: No   Active Ambulatory Problems    Diagnosis Date Noted   Hyperlipidemia 06/16/2021   Preventative health care 05/23/2023   Resolved Ambulatory Problems    Diagnosis Date Noted   No Resolved Ambulatory Problems   Past Medical History:  Diagnosis Date   GERD (gastroesophageal reflux disease)     Family History  Problem Relation Age of Onset   Arthritis Mother    Hyperlipidemia Father     Social History   Socioeconomic History   Marital status: Married    Spouse name: Not on file   Number of children: Not on file   Years of education: Not on file   Highest education level: Not on file  Occupational History   Not on file  Tobacco Use   Smoking status: Never   Smokeless tobacco: Never  Substance and Sexual Activity   Alcohol use: Yes   Drug use: No   Sexual activity: Yes  Other Topics Concern   Not on file  Social History Narrative   ** Merged History Encounter **       Social Determinants of Health   Financial Resource Strain: Not on file   Food Insecurity: Not on file  Transportation Needs: Not on file  Physical Activity: Not on file  Stress: Not on file  Social Connections: Not on file  Intimate Partner Violence: Not on file    ROS  General:  Negative for unexplained weight loss, fever Skin: Negative for new or changing mole, sore that won't heal HEENT: Negative for trouble hearing, trouble seeing, ringing in ears, mouth sores, hoarseness, change in voice, dysphagia. CV:  Negative for chest pain, dyspnea, edema, palpitations Resp: Negative for cough, dyspnea, hemoptysis GI: Negative for nausea, vomiting, diarrhea, constipation, abdominal pain, melena, hematochezia. GU: Negative for dysuria, incontinence, urinary hesitance, hematuria, vaginal or penile discharge, polyuria, sexual difficulty, lumps in testicle or breasts MSK: Negative for muscle cramps or aches, joint pain or swelling Neuro: Negative for headaches, weakness, numbness, dizziness, passing out/fainting Psych: Negative for depression, anxiety, memory problems  Objective  Physical Exam Vitals:   05/23/23 1519  BP: 138/88  Pulse: 79  Temp: 98.2 F (36.8 C)  SpO2: 100%    BP Readings from Last 3 Encounters:  05/23/23 138/88  04/04/20 (!) 141/80  09/01/12 126/75   Wt Readings from Last 3 Encounters:  05/23/23 168 lb 6.4 oz (76.4 kg)  04/04/20 165 lb (74.8 kg)  08/31/12 145 lb (65.8 kg) (33%, Z= -0.44)*   * Growth percentiles are based on CDC (Boys, 2-20 Years) data.  Physical Exam Constitutional:      General: He is not in acute distress.    Appearance: Normal appearance.  HENT:     Head: Normocephalic.     Right Ear: Tympanic membrane normal.     Left Ear: Tympanic membrane normal.     Nose: Nose normal.     Mouth/Throat:     Mouth: Mucous membranes are moist.     Pharynx: Oropharynx is clear.  Eyes:     Conjunctiva/sclera: Conjunctivae normal.     Pupils: Pupils are equal, round, and reactive to light.  Neck:     Thyroid: No  thyromegaly.  Cardiovascular:     Rate and Rhythm: Normal rate and regular rhythm.     Heart sounds: Normal heart sounds.  Pulmonary:     Effort: Pulmonary effort is normal.     Breath sounds: Normal breath sounds.  Abdominal:     General: Abdomen is flat. Bowel sounds are normal.     Palpations: Abdomen is soft. There is no mass.     Tenderness: There is no abdominal tenderness.  Musculoskeletal:        General: Normal range of motion.  Lymphadenopathy:     Cervical: No cervical adenopathy.  Skin:    General: Skin is warm and dry.     Findings: No rash.  Neurological:     General: No focal deficit present.     Mental Status: He is alert.  Psychiatric:        Mood and Affect: Mood normal.        Behavior: Behavior normal.    Assessment/Plan:   Preventative health care Assessment & Plan: Physical exam complete. Lab work as outlined, he will return to complete this when fasting. Will contact patient with results. Flu vaccine not due. Tetanus vaccine- UTD per patient. Declined additional COVID vaccines. Declined HIV and Hep C screenings. Recommended follow up with Dentist and establishing with Ophthalmology for annual exams. Encouraged to continue healthy diet and exercise. Return to care for fasting labs then in one year, sooner PRN.   Orders: -     CBC with Differential/Platelet; Future -     Comprehensive metabolic panel; Future  Moderate mixed hyperlipidemia not requiring statin therapy Assessment & Plan: Chronic. Will check fasting lipid panel. Counseled on healthy diet and exercise. Information provided to patient. Will continue to monitor.   Orders: -     Lipid panel; Future  Thyroid disorder screen -     TSH; Future    Return in about 1 year (around 05/22/2024) for Annual Exam, sooner PRN.   Bethanie Dicker, NP-C [facility] - [employer]

## 2023-05-23 NOTE — Assessment & Plan Note (Signed)
Physical exam complete. Lab work as outlined, he will return to complete this when fasting. Will contact patient with results. Flu vaccine not due. Tetanus vaccine- UTD per patient. Declined additional COVID vaccines. Declined HIV and Hep C screenings. Recommended follow up with Dentist and establishing with Ophthalmology for annual exams. Encouraged to continue healthy diet and exercise. Return to care for fasting labs then in one year, sooner PRN.

## 2023-05-25 ENCOUNTER — Other Ambulatory Visit: Payer: Managed Care, Other (non HMO)

## 2023-05-25 DIAGNOSIS — E782 Mixed hyperlipidemia: Secondary | ICD-10-CM | POA: Diagnosis not present

## 2023-05-25 DIAGNOSIS — Z1329 Encounter for screening for other suspected endocrine disorder: Secondary | ICD-10-CM | POA: Diagnosis not present

## 2023-05-25 DIAGNOSIS — Z0000 Encounter for general adult medical examination without abnormal findings: Secondary | ICD-10-CM

## 2023-05-25 LAB — CBC WITH DIFFERENTIAL/PLATELET
Basophils Absolute: 0 10*3/uL (ref 0.0–0.1)
Basophils Relative: 0.8 % (ref 0.0–3.0)
Eosinophils Absolute: 0.2 10*3/uL (ref 0.0–0.7)
Eosinophils Relative: 3.3 % (ref 0.0–5.0)
HCT: 48.2 % (ref 39.0–52.0)
Hemoglobin: 16.4 g/dL (ref 13.0–17.0)
Lymphocytes Relative: 36.3 % (ref 12.0–46.0)
Lymphs Abs: 1.9 10*3/uL (ref 0.7–4.0)
MCHC: 33.9 g/dL (ref 30.0–36.0)
MCV: 86.5 fl (ref 78.0–100.0)
Monocytes Absolute: 0.5 10*3/uL (ref 0.1–1.0)
Monocytes Relative: 9.5 % (ref 3.0–12.0)
Neutro Abs: 2.7 10*3/uL (ref 1.4–7.7)
Neutrophils Relative %: 50.1 % (ref 43.0–77.0)
Platelets: 260 10*3/uL (ref 150.0–400.0)
RBC: 5.58 Mil/uL (ref 4.22–5.81)
RDW: 13.3 % (ref 11.5–15.5)
WBC: 5.3 10*3/uL (ref 4.0–10.5)

## 2023-05-25 LAB — COMPREHENSIVE METABOLIC PANEL
ALT: 13 U/L (ref 0–53)
AST: 15 U/L (ref 0–37)
Albumin: 4.7 g/dL (ref 3.5–5.2)
Alkaline Phosphatase: 63 U/L (ref 39–117)
BUN: 17 mg/dL (ref 6–23)
CO2: 28 mEq/L (ref 19–32)
Calcium: 9.6 mg/dL (ref 8.4–10.5)
Chloride: 103 mEq/L (ref 96–112)
Creatinine, Ser: 0.98 mg/dL (ref 0.40–1.50)
GFR: 103.44 mL/min (ref >60.00)
Glucose, Bld: 101 mg/dL — ABNORMAL HIGH (ref 70–99)
Potassium: 3.9 mEq/L (ref 3.5–5.1)
Sodium: 141 mEq/L (ref 135–145)
Total Bilirubin: 1 mg/dL (ref 0.2–1.2)
Total Protein: 6.9 g/dL (ref 6.0–8.3)

## 2023-05-25 LAB — LIPID PANEL
Cholesterol: 231 mg/dL — ABNORMAL HIGH (ref 0–200)
HDL: 36.1 mg/dL — ABNORMAL LOW (ref >39.00)
LDL Cholesterol: 162 mg/dL — ABNORMAL HIGH (ref 0–99)
NonHDL: 195.12
Total CHOL/HDL Ratio: 6
Triglycerides: 167 mg/dL — ABNORMAL HIGH (ref 0.0–149.0)
VLDL: 33.4 mg/dL (ref 0.0–40.0)

## 2023-05-25 LAB — TSH: TSH: 1.59 u[IU]/mL (ref 0.35–5.50)

## 2023-10-18 ENCOUNTER — Other Ambulatory Visit: Payer: Self-pay | Admitting: Nurse Practitioner

## 2023-10-18 ENCOUNTER — Telehealth: Payer: Self-pay

## 2023-10-18 DIAGNOSIS — E782 Mixed hyperlipidemia: Secondary | ICD-10-CM

## 2023-10-18 NOTE — Telephone Encounter (Signed)
Copied from CRM [id number]. Topic: General - Other >> Oct 18, 2023  1:57 PM Truddie Crumble wrote: Reason for CRM: patient called stating he want to make a lab appointment for his lipid but there are no orders in

## 2023-11-03 ENCOUNTER — Other Ambulatory Visit (INDEPENDENT_AMBULATORY_CARE_PROVIDER_SITE_OTHER): Payer: Managed Care, Other (non HMO)

## 2023-11-03 DIAGNOSIS — E782 Mixed hyperlipidemia: Secondary | ICD-10-CM

## 2023-11-04 LAB — LIPID PANEL
Cholesterol: 211 mg/dL — ABNORMAL HIGH (ref <200)
HDL: 39 mg/dL — ABNORMAL LOW (ref >=40)
LDL Cholesterol (Calc): 143 mg/dL — ABNORMAL HIGH
Non-HDL Cholesterol (Calc): 172 mg/dL — ABNORMAL HIGH (ref <130)
Total CHOL/HDL Ratio: 5.4 (calc) — ABNORMAL HIGH (ref <5.0)
Triglycerides: 159 mg/dL — ABNORMAL HIGH (ref <150)

## 2023-11-07 ENCOUNTER — Encounter: Payer: Self-pay | Admitting: Nurse Practitioner

## 2024-05-24 ENCOUNTER — Encounter: Payer: Self-pay | Admitting: Nurse Practitioner

## 2024-05-24 ENCOUNTER — Ambulatory Visit: Payer: Managed Care, Other (non HMO) | Admitting: Nurse Practitioner

## 2024-05-24 VITALS — BP 136/88 | HR 109 | Temp 98.5°F | Ht 70.0 in | Wt 165.6 lb

## 2024-05-24 DIAGNOSIS — H6123 Impacted cerumen, bilateral: Secondary | ICD-10-CM

## 2024-05-24 DIAGNOSIS — Z1329 Encounter for screening for other suspected endocrine disorder: Secondary | ICD-10-CM | POA: Diagnosis not present

## 2024-05-24 DIAGNOSIS — Z0000 Encounter for general adult medical examination without abnormal findings: Secondary | ICD-10-CM

## 2024-05-24 DIAGNOSIS — E782 Mixed hyperlipidemia: Secondary | ICD-10-CM

## 2024-05-24 NOTE — Progress Notes (Signed)
 Leron Glance, NP-C Phone: [phone]  Arthur Rodriguez is a 33 y.o. male who presents today for annual exam.   Discussed the use of AI scribe software for clinical note transcription with the patient, who gave verbal consent to proceed.  History of Present Illness   Arthur Rodriguez is a 33 year old male who presents for an annual physical exam.  He was ill approximately a month ago with symptoms lasting for about three weeks, including severe congestion. He required antibiotics and was told by the office it was probably something like bronchitis. He is now recovering from the illness.  He has a history of elevated cholesterol, with previous levels recorded at 143 mg/dL. He is concerned about his cholesterol levels and the potential need for medication if lifestyle changes do not suffice. He is conscious of his dietary choices, acknowledging a family history of cholesterol issues. He tries to walk or lift weights daily, although his frequent work travel sometimes disrupts his routine. He attempts to eat healthily when dining out, choosing chicken or fish over red meat, and incorporates vegetables like salads and broccoli when possible. At home, he maintains a balanced diet with chicken, fish, and plenty of vegetables.  No chest pain, shortness of breath, abdominal pain, constipation, diarrhea, burning during urination, headaches, dizziness, trouble swallowing, skin changes, rashes, swelling in the legs, joint pain, mood problems, anxiety, depression, or trouble sleeping. He reports occasional hearing difficulties, possibly related to earwax buildup, especially in the right ear.      Social History   Tobacco Use  Smoking Status Never  Smokeless Tobacco Never    No current outpatient medications on file prior to visit.   No current facility-administered medications on file prior to visit.     ROS see history of present illness  Objective  Physical Exam Vitals:   05/24/24 1439   BP: 136/88  Pulse: (!) 109  Temp: 98.5 F (36.9 C)  SpO2: 98%    BP Readings from Last 3 Encounters:  05/24/24 136/88  05/23/23 138/88  04/04/20 (!) 141/80   Wt Readings from Last 3 Encounters:  05/24/24 165 lb 9.6 oz (75.1 kg)  05/23/23 168 lb 6.4 oz (76.4 kg)  04/04/20 165 lb (74.8 kg)    Physical Exam Constitutional:      General: He is not in acute distress.    Appearance: Normal appearance.  HENT:     Head: Normocephalic.     Right Ear: Tympanic membrane normal. There is impacted cerumen.     Left Ear: Tympanic membrane normal. There is impacted cerumen.     Nose: Nose normal.     Mouth/Throat:     Mouth: Mucous membranes are moist.     Pharynx: Oropharynx is clear.  Eyes:     Conjunctiva/sclera: Conjunctivae normal.     Pupils: Pupils are equal, round, and reactive to light.  Neck:     Thyroid: No thyromegaly.  Cardiovascular:     Rate and Rhythm: Normal rate and regular rhythm.     Heart sounds: Normal heart sounds.  Pulmonary:     Effort: Pulmonary effort is normal.     Breath sounds: Normal breath sounds.  Abdominal:     General: Abdomen is flat. Bowel sounds are normal.     Palpations: Abdomen is soft. There is no mass.     Tenderness: There is no abdominal tenderness.  Musculoskeletal:        General: Normal range of motion.  Lymphadenopathy:  Cervical: No cervical adenopathy.  Skin:    General: Skin is warm and dry.     Findings: No rash.  Neurological:     General: No focal deficit present.     Mental Status: He is alert.  Psychiatric:        Mood and Affect: Mood normal.        Behavior: Behavior normal.      Assessment/Plan: Please see individual problem list.  Preventative health care Assessment & Plan: Physical exam complete. We will check lab work as outlined. Declines flu vaccine and additional COVID vaccines. His last tetanus vaccine was is 2017 and is up to date. He does not smoke or use drugs, rare alcohol consumption.  Encourage annual eye exams, continue routine dental exams. Encourage continued healthy diet and regular exercise. Return to care in one year, sooner as needed.   Orders: -     CBC with Differential/Platelet  Moderate mixed hyperlipidemia not requiring statin therapy Assessment & Plan: LDL previously at 143 mg/dL. Discussed potential need for statins if lifestyle changes are insufficient. Risk factors are low. Check fasting lipid panel today. Encourage continued healthy diet and regular exercise.   Orders: -     Comprehensive metabolic panel with GFR -     Lipid panel  Bilateral impacted cerumen Assessment & Plan: Cerumen impaction causing occasional hearing difficulties, possibly due to frequent headphone use. Use Debrox drops for cerumen softening. Consider a nurse visit for ear irrigation if self-care is ineffective.    Thyroid disorder screen -     TSH     Return in about 1 year (around 05/24/2025) for Annual Exam, sooner as needed.   Leron Glance, NP-C [facility] - [facility]

## 2024-05-25 LAB — COMPREHENSIVE METABOLIC PANEL WITH GFR
ALT: 17 IU/L (ref 0–44)
AST: 19 IU/L (ref 0–40)
Albumin: 4.9 g/dL (ref 4.1–5.1)
Alkaline Phosphatase: 83 IU/L (ref 47–123)
BUN/Creatinine Ratio: 9 (ref 9–20)
BUN: 9 mg/dL (ref 6–20)
Bilirubin Total: 0.7 mg/dL (ref 0.0–1.2)
CO2: 22 mmol/L (ref 20–29)
Calcium: 9.5 mg/dL (ref 8.7–10.2)
Chloride: 102 mmol/L (ref 96–106)
Creatinine, Ser: 1 mg/dL (ref 0.76–1.27)
Globulin, Total: 2.4 g/dL (ref 1.5–4.5)
Glucose: 98 mg/dL (ref 70–99)
Potassium: 3.8 mmol/L (ref 3.5–5.2)
Sodium: 141 mmol/L (ref 134–144)
Total Protein: 7.3 g/dL (ref 6.0–8.5)
eGFR: 103 mL/min/1.73 (ref >59)

## 2024-05-25 LAB — CBC WITH DIFFERENTIAL/PLATELET
Basophils Absolute: 0.1 x10E3/uL (ref 0.0–0.2)
Basos: 1 %
EOS (ABSOLUTE): 0.1 x10E3/uL (ref 0.0–0.4)
Eos: 1 %
Hematocrit: 51.8 % — ABNORMAL HIGH (ref 37.5–51.0)
Hemoglobin: 17 g/dL (ref 13.0–17.7)
Immature Grans (Abs): 0 x10E3/uL (ref 0.0–0.1)
Immature Granulocytes: 0 %
Lymphocytes Absolute: 2 x10E3/uL (ref 0.7–3.1)
Lymphs: 31 %
MCH: 29.4 pg (ref 26.6–33.0)
MCHC: 32.8 g/dL (ref 31.5–35.7)
MCV: 90 fL (ref 79–97)
Monocytes Absolute: 0.5 x10E3/uL (ref 0.1–0.9)
Monocytes: 8 %
Neutrophils Absolute: 3.8 x10E3/uL (ref 1.4–7.0)
Neutrophils: 59 %
Platelets: 261 x10E3/uL (ref 150–450)
RBC: 5.78 x10E6/uL (ref 4.14–5.80)
RDW: 12.7 % (ref 11.6–15.4)
WBC: 6.5 x10E3/uL (ref 3.4–10.8)

## 2024-05-25 LAB — LIPID PANEL
Chol/HDL Ratio: 6.5 ratio — ABNORMAL HIGH (ref 0.0–5.0)
Cholesterol, Total: 254 mg/dL — ABNORMAL HIGH (ref 100–199)
HDL: 39 mg/dL — ABNORMAL LOW (ref >39)
LDL Chol Calc (NIH): 165 mg/dL — ABNORMAL HIGH (ref 0–99)
Triglycerides: 266 mg/dL — ABNORMAL HIGH (ref 0–149)
VLDL Cholesterol Cal: 50 mg/dL — ABNORMAL HIGH (ref 5–40)

## 2024-05-25 LAB — TSH: TSH: 1.51 u[IU]/mL (ref 0.450–4.500)

## 2024-05-29 ENCOUNTER — Encounter: Payer: Self-pay | Admitting: Nurse Practitioner

## 2024-06-03 ENCOUNTER — Telehealth: Payer: Self-pay

## 2024-06-03 ENCOUNTER — Ambulatory Visit: Payer: Self-pay | Admitting: Nurse Practitioner

## 2024-06-03 DIAGNOSIS — E782 Mixed hyperlipidemia: Secondary | ICD-10-CM

## 2024-06-03 MED ORDER — ROSUVASTATIN CALCIUM 5 MG PO TABS: 5.0000 mg | ORAL_TABLET | Freq: Every day | ORAL | 3 refills | Status: AC

## 2024-06-03 NOTE — Telephone Encounter (Signed)
 Gretel App, NP to [facility] Clinical  (Selected Message)    06/03/24  1:11 PM Result Note Needs fasting lab appt in 6 weeks.

## 2024-06-07 ENCOUNTER — Encounter: Payer: Self-pay | Admitting: Nurse Practitioner

## 2024-06-07 DIAGNOSIS — H6123 Impacted cerumen, bilateral: Secondary | ICD-10-CM | POA: Insufficient documentation

## 2024-06-07 NOTE — Assessment & Plan Note (Signed)
 LDL previously at 143 mg/dL. Discussed potential need for statins if lifestyle changes are insufficient. Risk factors are low. Check fasting lipid panel today. Encourage continued healthy diet and regular exercise.

## 2024-06-07 NOTE — Assessment & Plan Note (Signed)
 Cerumen impaction causing occasional hearing difficulties, possibly due to frequent headphone use. Use Debrox drops for cerumen softening. Consider a nurse visit for ear irrigation if self-care is ineffective.

## 2024-06-07 NOTE — Assessment & Plan Note (Signed)
 Physical exam complete. We will check lab work as outlined. Declines flu vaccine and additional COVID vaccines. His last tetanus vaccine was is 2017 and is up to date. He does not smoke or use drugs, rare alcohol consumption. Encourage annual eye exams, continue routine dental exams. Encourage continued healthy diet and regular exercise. Return to care in one year, sooner as needed.

## 2024-07-04 ENCOUNTER — Ambulatory Visit: Admitting: Nurse Practitioner

## 2024-07-04 ENCOUNTER — Encounter: Payer: Self-pay | Admitting: Nurse Practitioner

## 2024-07-04 VITALS — BP 110/80 | HR 78 | Temp 98.0°F | Ht 70.0 in | Wt 167.4 lb

## 2024-07-04 DIAGNOSIS — K219 Gastro-esophageal reflux disease without esophagitis: Secondary | ICD-10-CM | POA: Insufficient documentation

## 2024-07-04 MED ORDER — PANTOPRAZOLE SODIUM 40 MG PO TBEC: 40.0000 mg | DELAYED_RELEASE_TABLET | Freq: Every day | ORAL | 0 refills | Status: AC

## 2024-07-04 NOTE — Assessment & Plan Note (Addendum)
 Symptoms consistent with GERD, worsened by caffeine and alcohol, with partial relief from Pepcid  and Tums. Prescribe pantoprazole  once daily for at least eight weeks. Advise avoiding caffeine, alcohol, citrus, tomatoes, and lying down after meals. Instruct to refrain from eating 2-3 hours before bedtime. Discuss potential long-term PPI side effects. Reassess symptoms after eight weeks and consider dose reduction if symptoms improve. We will continue to monitor.

## 2024-07-04 NOTE — Progress Notes (Signed)
 Leron Glance, NP-C Phone: [phone]  Arthur Rodriguez is a 33 y.o. male who presents today for burning in throat.   Discussed the use of AI scribe software for clinical note transcription with the patient, who gave verbal consent to proceed.  History of Present Illness   Arthur Rodriguez is a 33 year old male who presents with throat burning and tingling sensations.  He experiences a tingling sensation in his throat that progresses to a burning feeling. These symptoms began after treatment with antibiotics for a respiratory infection and starting on Crestor for cholesterol management. The symptoms worsen with coffee and alcohol consumption. He has been taking Pepcid  and Tums for the past three days, which have provided some relief, but the symptoms persist.  The sensation starts with a tingle that sometimes induces a cough and intensifies to a burning sensation, especially after consuming certain foods and drinks. The burning is intermittent, particularly after drinking coffee or alcohol. He denies regurgitation of acid but reports increased burping. The symptoms are more noticeable in the morning and sometimes when driving, despite being in an upright position. No pain when swallowing and no chest pain are reported.  He started experiencing these symptoms after beginning Crestor, which he takes daily. He has missed doses occasionally, about four or five times. He also experienced muscle aches for three to four days, which he attributes to either the medication or his gym activities, but these aches have since resolved.     Social History   Tobacco Use  Smoking Status Never  Smokeless Tobacco Never    Current Outpatient Medications on File Prior to Visit  Medication Sig Dispense Refill   rosuvastatin (CRESTOR) 5 MG tablet Take 1 tablet (5 mg total) by mouth daily. 90 tablet 3   No current facility-administered medications on file prior to visit.    ROS see history of present  illness  Objective  Physical Exam Vitals:   07/04/24 1105  BP: 110/80  Pulse: 78  Temp: 98 F (36.7 C)  SpO2: 98%    BP Readings from Last 3 Encounters:  07/04/24 110/80  05/24/24 136/88  05/23/23 138/88   Wt Readings from Last 3 Encounters:  07/04/24 167 lb 6.4 oz (75.9 kg)  05/24/24 165 lb 9.6 oz (75.1 kg)  05/23/23 168 lb 6.4 oz (76.4 kg)    Physical Exam Constitutional:      General: He is not in acute distress.    Appearance: Normal appearance.  HENT:     Head: Normocephalic.     Right Ear: Tympanic membrane normal.     Left Ear: Tympanic membrane normal.     Nose: Nose normal.     Mouth/Throat:     Mouth: Mucous membranes are moist.     Pharynx: Oropharynx is clear. No posterior oropharyngeal erythema.  Eyes:     Conjunctiva/sclera: Conjunctivae normal.     Pupils: Pupils are equal, round, and reactive to light.  Cardiovascular:     Rate and Rhythm: Normal rate and regular rhythm.     Heart sounds: Normal heart sounds.  Pulmonary:     Effort: Pulmonary effort is normal.     Breath sounds: Normal breath sounds.  Lymphadenopathy:     Cervical: No cervical adenopathy.  Skin:    General: Skin is warm and dry.  Neurological:     General: No focal deficit present.     Mental Status: He is alert.  Psychiatric:        Mood and  Affect: Mood normal.        Behavior: Behavior normal.      Assessment/Plan: Please see individual problem list.  Gastroesophageal reflux disease, unspecified whether esophagitis present Assessment & Plan: Symptoms consistent with GERD, worsened by caffeine and alcohol, with partial relief from Pepcid  and Tums. Prescribe pantoprazole  once daily for at least eight weeks. Advise avoiding caffeine, alcohol, citrus, tomatoes, and lying down after meals. Instruct to refrain from eating 2-3 hours before bedtime. Discuss potential long-term PPI side effects. Reassess symptoms after eight weeks and consider dose reduction if symptoms  improve. We will continue to monitor.   Orders: -     Pantoprazole  Sodium; Take 1 tablet (40 mg total) by mouth daily.  Dispense: 90 tablet; Refill: 0     Return if symptoms worsen or fail to improve.   Leron Glance, NP-C [facility] - [facility]

## 2024-07-05 ENCOUNTER — Other Ambulatory Visit

## 2024-07-08 ENCOUNTER — Other Ambulatory Visit

## 2024-07-08 DIAGNOSIS — E782 Mixed hyperlipidemia: Secondary | ICD-10-CM | POA: Diagnosis not present

## 2024-07-08 LAB — LIPID PANEL
Cholesterol: 167 mg/dL (ref 0–200)
HDL: 36.6 mg/dL — ABNORMAL LOW (ref >39.00)
LDL Cholesterol: 94 mg/dL (ref 0–99)
NonHDL: 130.86
Total CHOL/HDL Ratio: 5
Triglycerides: 184 mg/dL — ABNORMAL HIGH (ref 0.0–149.0)
VLDL: 36.8 mg/dL (ref 0.0–40.0)

## 2024-07-08 LAB — HEPATIC FUNCTION PANEL
ALT: 17 U/L (ref 0–53)
AST: 17 U/L (ref 0–37)
Albumin: 4.9 g/dL (ref 3.5–5.2)
Alkaline Phosphatase: 66 U/L (ref 39–117)
Bilirubin, Direct: 0.2 mg/dL (ref 0.0–0.3)
Total Bilirubin: 1 mg/dL (ref 0.2–1.2)
Total Protein: 7.6 g/dL (ref 6.0–8.3)

## 2024-07-10 ENCOUNTER — Ambulatory Visit: Payer: Self-pay | Admitting: Nurse Practitioner

## 2025-05-27 ENCOUNTER — Encounter: Admitting: Nurse Practitioner
